# Patient Record
Sex: Male | Born: 2000 | Race: White | Hispanic: Yes | Marital: Single | Smoking: Never smoker
Health system: Southern US, Community
[De-identification: ages and names within clinical notes are randomized; demographics above are authoritative.]

## PROBLEM LIST (undated history)

## (undated) DIAGNOSIS — T783XXA Angioneurotic edema, initial encounter: Secondary | ICD-10-CM

## (undated) HISTORY — DX: Angioneurotic edema, initial encounter: T78.3XXA

---
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
## 2021-12-14 IMAGING — DX DG RIBS W/ CHEST 3+V*L*
5 series · 5 of 5 positions shown · non-contrast
Comparison: None.

CLINICAL DATA: Left-sided chest pain following wrestling injury 2
months ago, initial encounter

EXAM:
LEFT RIBS AND CHEST - 3+ VIEW

[chest pa]
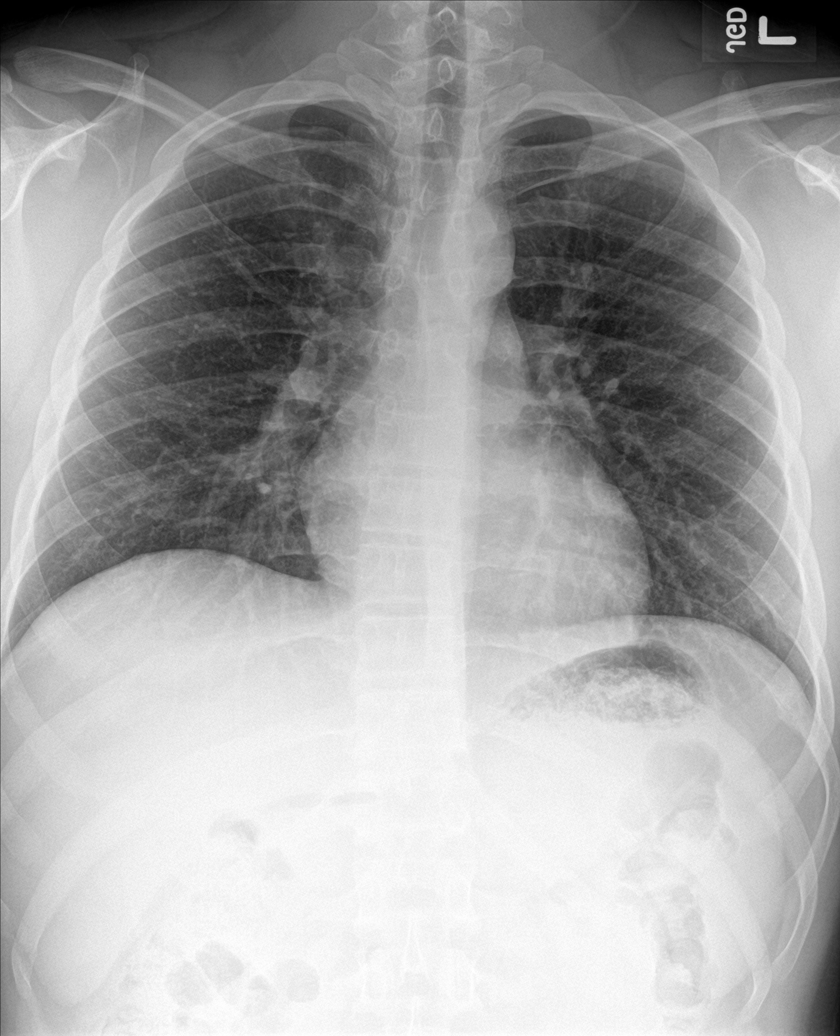

[rib obl (1 of 2)]
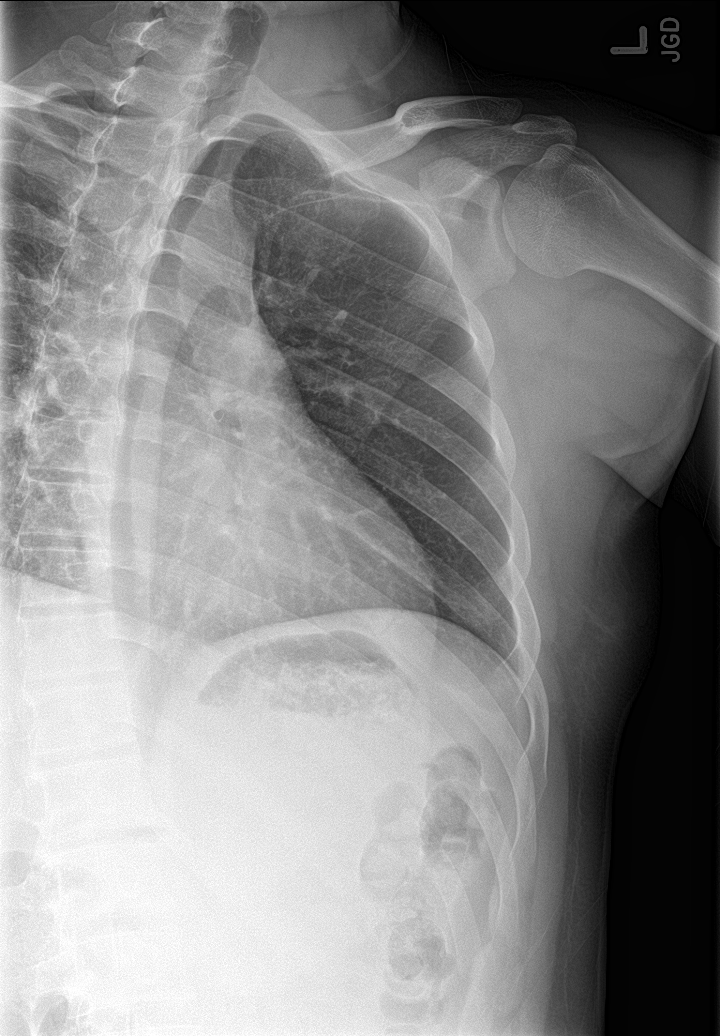

[rib obl (2 of 2)]
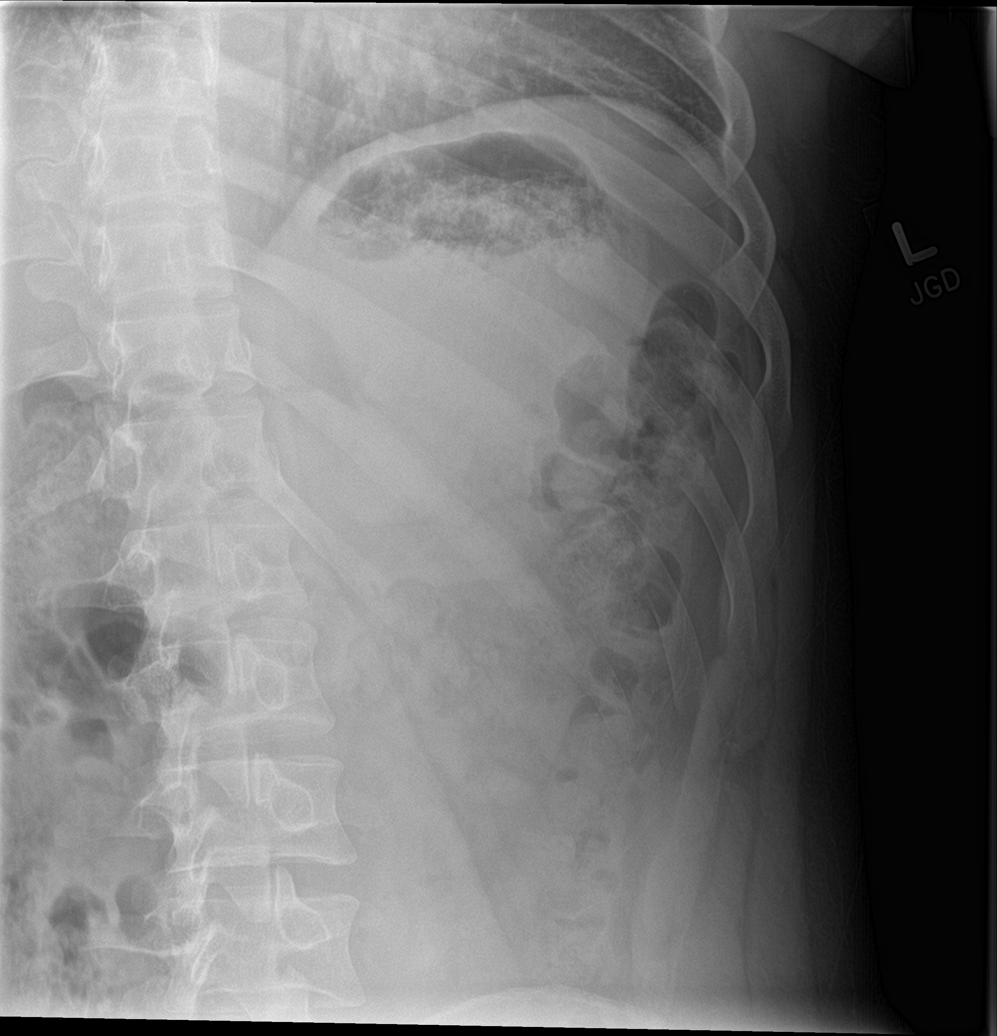

[rib ap (1 of 2)]
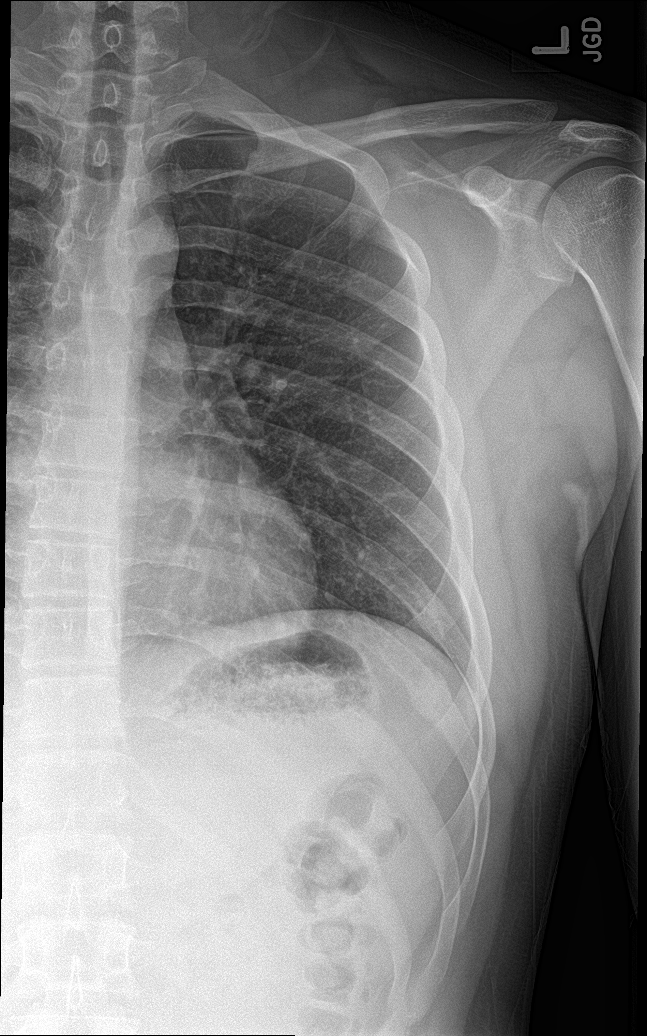

[rib ap (2 of 2)]
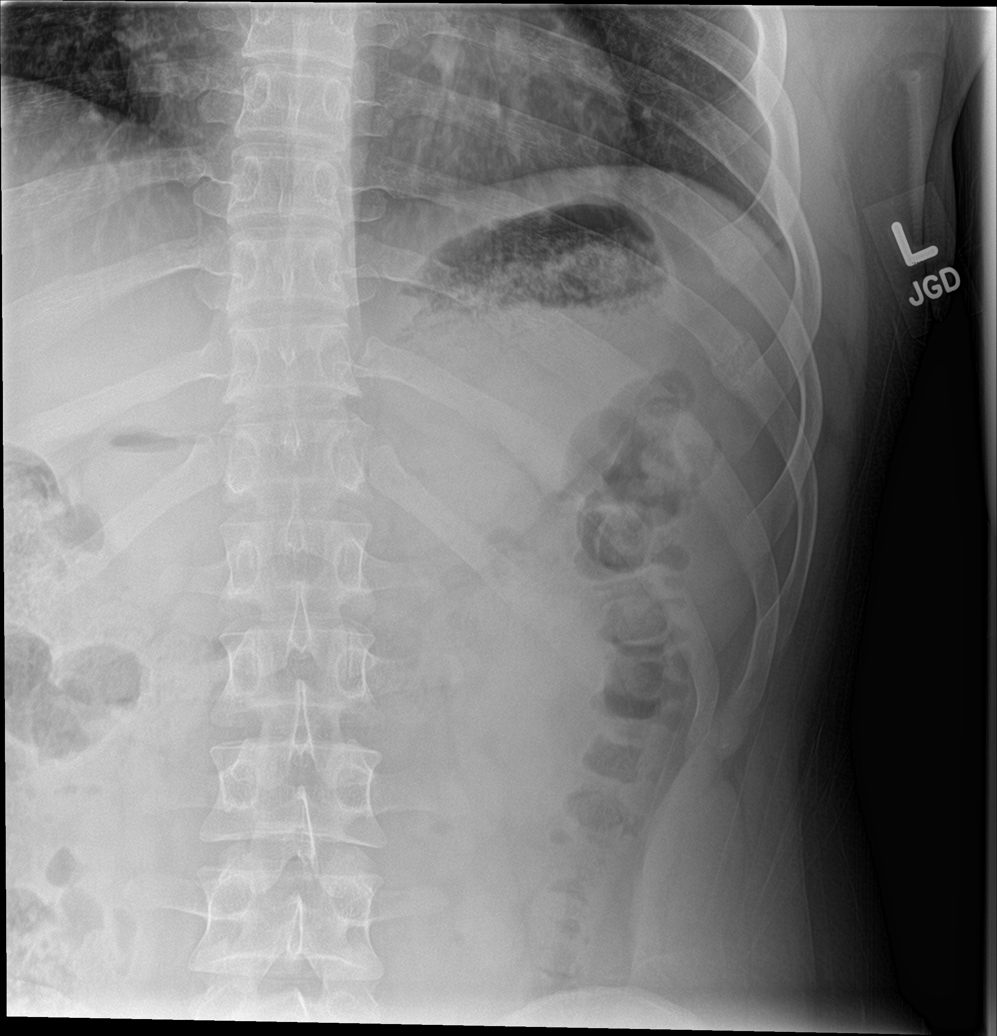

[5 of 5 positions shown; findings below may reference images not displayed]

FINDINGS: Cardiac shadow is within normal limits. The lungs are clear
bilaterally. Healing rib fractures are noted of the left eighth and
ninth ribs posteriorly. No acute fractures are noted. No other focal
abnormality is seen.
IMPRESSION: Healing left eighth and ninth rib fractures consistent with the
given clinical history.
